# Patient Record
Sex: Male | Born: 1970 | Hispanic: Yes | Marital: Single | Smoking: Never smoker
Health system: Southern US, Community
[De-identification: ages and names within clinical notes are randomized; demographics above are authoritative.]

---

## 2017-12-21 ENCOUNTER — Encounter (HOSPITAL_COMMUNITY): Payer: Self-pay | Admitting: Emergency Medicine

## 2017-12-21 ENCOUNTER — Other Ambulatory Visit: Payer: Self-pay

## 2017-12-21 DIAGNOSIS — R1032 Left lower quadrant pain: Secondary | ICD-10-CM | POA: Insufficient documentation

## 2017-12-21 DIAGNOSIS — M545 Low back pain: Secondary | ICD-10-CM | POA: Insufficient documentation

## 2017-12-21 NOTE — ED Triage Notes (Signed)
Pt presents with LLQ pain that radiated to his lower back and makes it difficult for him to raise his leg. Patient denies urinary or GI symptoms.

## 2017-12-22 ENCOUNTER — Emergency Department (HOSPITAL_COMMUNITY): Payer: Self-pay

## 2017-12-22 ENCOUNTER — Emergency Department (HOSPITAL_COMMUNITY)
Admission: EM | Admit: 2017-12-22 | Discharge: 2017-12-22 | Disposition: A | Discharge status: Home / Self Care | Payer: Self-pay | Attending: Emergency Medicine | Admitting: Emergency Medicine

## 2017-12-22 DIAGNOSIS — M545 Low back pain, unspecified: Secondary | ICD-10-CM

## 2017-12-22 LAB — CBC WITH DIFFERENTIAL/PLATELET
ABS IMMATURE GRANULOCYTES: 0.02 10*3/uL (ref 0.00–0.07)
Basophils Absolute: 0 10*3/uL (ref 0.0–0.1)
Basophils Relative: 0 %
Eosinophils Absolute: 0 10*3/uL (ref 0.0–0.5)
Eosinophils Relative: 0 %
HCT: 45.7 % (ref 39.0–52.0)
Hemoglobin: 15.8 g/dL (ref 13.0–17.0)
Immature Granulocytes: 0 %
Lymphocytes Relative: 13 %
Lymphs Abs: 1.3 10*3/uL (ref 0.7–4.0)
MCH: 30.4 pg (ref 26.0–34.0)
MCHC: 34.6 g/dL (ref 30.0–36.0)
MCV: 87.9 fL (ref 80.0–100.0)
MONO ABS: 0.5 10*3/uL (ref 0.1–1.0)
Monocytes Relative: 5 %
NEUTROS ABS: 7.7 10*3/uL (ref 1.7–7.7)
Neutrophils Relative %: 82 %
Platelets: 281 10*3/uL (ref 150–400)
RBC: 5.2 MIL/uL (ref 4.22–5.81)
RDW: 12.2 % (ref 11.5–15.5)
WBC: 9.6 10*3/uL (ref 4.0–10.5)
nRBC: 0 % (ref 0.0–0.2)

## 2017-12-22 LAB — URINALYSIS, ROUTINE W REFLEX MICROSCOPIC
Bacteria, UA: NONE SEEN
Bilirubin Urine: NEGATIVE
Glucose, UA: NEGATIVE mg/dL
Ketones, ur: 5 mg/dL — AB
Leukocytes, UA: NEGATIVE
Nitrite: NEGATIVE
Protein, ur: NEGATIVE mg/dL
Specific Gravity, Urine: 1.028 (ref 1.005–1.030)
pH: 5 (ref 5.0–8.0)

## 2017-12-22 LAB — COMPREHENSIVE METABOLIC PANEL
ALBUMIN: 5.1 g/dL — AB (ref 3.5–5.0)
ALT: 28 U/L (ref 0–44)
AST: 29 U/L (ref 15–41)
Alkaline Phosphatase: 64 U/L (ref 38–126)
Anion gap: 9 (ref 5–15)
BUN: 18 mg/dL (ref 6–20)
CO2: 25 mmol/L (ref 22–32)
Calcium: 9.5 mg/dL (ref 8.9–10.3)
Chloride: 107 mmol/L (ref 98–111)
Creatinine, Ser: 0.76 mg/dL (ref 0.61–1.24)
GFR calc Af Amer: >60 mL/min (ref >60)
GFR calc non Af Amer: >60 mL/min (ref >60)
Glucose, Bld: 104 mg/dL — ABNORMAL HIGH (ref 70–99)
POTASSIUM: 3.9 mmol/L (ref 3.5–5.1)
SODIUM: 141 mmol/L (ref 135–145)
Total Bilirubin: 1.1 mg/dL (ref 0.3–1.2)
Total Protein: 7.5 g/dL (ref 6.5–8.1)

## 2017-12-22 LAB — LIPASE, BLOOD: LIPASE: 26 U/L (ref 11–51)

## 2017-12-22 MED ORDER — CYCLOBENZAPRINE HCL 10 MG PO TABS
10.0000 mg | ORAL_TABLET | Freq: Once | ORAL | Status: AC
  Administered 2017-12-22: 10 mg via ORAL
  Filled 2017-12-22: qty 1

## 2017-12-22 MED ORDER — CYCLOBENZAPRINE HCL 10 MG PO TABS: 10.0000 mg | ORAL_TABLET | Freq: Three times a day (TID) | ORAL | 0 refills | Status: AC | PRN

## 2017-12-22 MED ORDER — KETOROLAC TROMETHAMINE 30 MG/ML IJ SOLN
30.0000 mg | Freq: Once | INTRAMUSCULAR | Status: AC
  Administered 2017-12-22: 30 mg via INTRAVENOUS
  Filled 2017-12-22: qty 1

## 2017-12-22 MED ORDER — TRAMADOL HCL 50 MG PO TABS: 50.0000 mg | ORAL_TABLET | Freq: Four times a day (QID) | ORAL | 0 refills | Status: AC | PRN

## 2017-12-22 MED ORDER — ONDANSETRON HCL 4 MG/2ML IJ SOLN
4.0000 mg | Freq: Once | INTRAMUSCULAR | Status: AC
  Administered 2017-12-22: 4 mg via INTRAVENOUS
  Filled 2017-12-22: qty 2

## 2017-12-22 MED ORDER — MORPHINE SULFATE (PF) 4 MG/ML IV SOLN
4.0000 mg | Freq: Once | INTRAVENOUS | Status: AC
  Administered 2017-12-22: 4 mg via INTRAVENOUS
  Filled 2017-12-22: qty 1

## 2017-12-22 NOTE — ED Provider Notes (Signed)
[facility]-EMERGENCY DEPT Provider Note   CSN: [id number] Arrival date & time: 12/21/17  2313     History   Chief Complaint Chief Complaint  Patient presents with  . Abdominal Pain    HPI Patrick Fischer is a 47 y.o. male.  The history is provided by the patient.  Abdominal Pain    He complains of pain in the left flank radiating to the left lower abdomen for the last 3 days.  Pain is worse with movement.  He denies nausea or vomiting and denies any urinary urgency or frequency or tenesmus or dysuria.  He denies fever, chills, sweats.  He has not taken anything for pain.  Is unable to put a number on the pain.  He denies any recent trauma or unusual activity.  History reviewed. No pertinent past medical history.  There are no active problems to display for this patient.   History reviewed. No pertinent surgical history.      Home Medications    Prior to Admission medications   Not on File    Family History No family history on file.  Social History Social History   Tobacco Use  . Smoking status: Never Smoker  . Smokeless tobacco: Never Used  Substance Use Topics  . Alcohol use: Yes  . Drug use: Never     Allergies   Patient has no known allergies.   Review of Systems Review of Systems  Gastrointestinal: Positive for abdominal pain.  All other systems reviewed and are negative.    Physical Exam Updated Vital Signs BP 122/82   Pulse 68   Temp 97.6 F (36.4 C) (Oral)   Resp 20   SpO2 100%   Physical Exam Nursing note reviewed. Exam conducted with a chaperone present.    47 year old male, resting comfortably and in no acute distress. Vital signs are normal. Oxygen saturation is 100%, which is normal. Head is normocephalic and atraumatic. PERRLA, EOMI. Oropharynx is clear. Neck is nontender and supple without adenopathy or JVD. Back has mild tenderness in the mid lumbar area.  There is mild left CVA tenderness.   Straight leg raise is positive on the left at 30 degrees.  There is mild to moderate bilateral paralumbar spasm. Lungs are clear without rales, wheezes, or rhonchi. Chest is nontender. Heart has regular rate and rhythm without murmur. Abdomen is soft, flat, with mild left lower quadrant tenderness.  There is no rebound or guarding.  There are no masses or hepatosplenomegaly and peristalsis is hypoactive. Extremities have no cyanosis or edema, full range of motion is present. Skin is warm and dry without rash. Neurologic: Mental status is normal, cranial nerves are intact, there are no motor or sensory deficits.  ED Treatments / Results  Labs (all labs ordered are listed, but only abnormal results are displayed) Labs Reviewed  COMPREHENSIVE METABOLIC PANEL - Abnormal; Notable for the following components:      Result Value   Glucose, Bld 104 (*)    Albumin 5.1 (*)    All other components within normal limits  URINALYSIS, ROUTINE W REFLEX MICROSCOPIC - Abnormal; Notable for the following components:   APPearance HAZY (*)    Hgb urine dipstick SMALL (*)    Ketones, ur 5 (*)    All other components within normal limits  LIPASE, BLOOD  CBC WITH DIFFERENTIAL/PLATELET   Radiology Ct Renal Stone Study  Result Date: 12/22/2017 CLINICAL DATA:  Acute onset of lower back pain and flank pain.  EXAM: CT ABDOMEN AND PELVIS WITHOUT CONTRAST TECHNIQUE: Multidetector CT imaging of the abdomen and pelvis was performed following the standard protocol without IV contrast. COMPARISON:  None. FINDINGS: Lower chest: The visualized lung bases are clear. The visualized portions of the mediastinum are unremarkable. Hepatobiliary: The liver is unremarkable in appearance. The gallbladder is unremarkable in appearance. The common bile duct remains normal in caliber. Pancreas: The pancreas is within normal limits. Spleen: The spleen is unremarkable in appearance. Adrenals/Urinary Tract: The adrenal glands are  unremarkable in appearance. The kidneys are within normal limits. There is no evidence of hydronephrosis. No renal or ureteral stones are identified. No perinephric stranding is seen. Stomach/Bowel: The stomach is unremarkable in appearance. The small bowel is within normal limits. The appendix is normal in caliber, without evidence of appendicitis. The colon is unremarkable in appearance. Vascular/Lymphatic: Mild scattered calcification is seen along the abdominal aorta and its branches. The abdominal aorta is otherwise grossly unremarkable. The inferior vena cava is grossly unremarkable. No retroperitoneal lymphadenopathy is seen. No pelvic sidewall lymphadenopathy is identified. Reproductive: The bladder is decompressed and not well characterized. The prostate remains normal in size. Other: No additional soft tissue abnormalities are seen. Musculoskeletal: No acute osseous abnormalities are identified. The visualized musculature is unremarkable in appearance. IMPRESSION: No acute abnormality seen within the abdomen or pelvis. Aortic Atherosclerosis (ICD10-I70.0). Electronically Signed   By: Roanna RaiderJeffery  Chang M.D.   On: 12/22/2017 00:46    Procedures Procedures   Medications Ordered in ED Medications  cyclobenzaprine (FLEXERIL) tablet 10 mg (has no administration in time range)  morphine 4 MG/ML injection 4 mg (4 mg Intravenous Given 12/22/17 0042)  ketorolac (TORADOL) 30 MG/ML injection 30 mg (30 mg Intravenous Given 12/22/17 0042)  ondansetron (ZOFRAN) injection 4 mg (4 mg Intravenous Given 12/22/17 0042)     Initial Impression / Assessment and Plan / ED Course  I have reviewed the triage vital signs and the nursing notes.  Pertinent labs & imaging results that were available during my care of the patient were reviewed by me and considered in my medical decision making (see chart for details).  Left flank and left lower quadrant pain having characteristics of both musculoskeletal pain and  urolithiasis.  Will check screening labs and sent for renal stone protocol CT scan.  Will be given ketorolac, morphine, ondansetron for pain.  He has no prior [facility] in [facility].  He feels much better after above-noted treatment.  CT shows no evidence of urolithiasis or other significant pathology.  Labs are unremarkable.  Pain appears to be strictly musculoskeletal.  He is discharged with prescriptions for tramadol and cyclobenzaprine, told to use over-the-counter NSAIDs and acetaminophen as needed for less severe pain.  Final Clinical Impressions(s) / ED Diagnoses   Final diagnoses:  Acute left-sided low back pain without sciatica    ED Discharge Orders         Ordered    traMADol (ULTRAM) 50 MG tablet  Every 6 hours PRN     12/22/17 0224    cyclobenzaprine (FLEXERIL) 10 MG tablet  3 times daily PRN     12/22/17 0224           Dione BoozeGlick, Hagan Maltz, MD 12/22/17 [phone]

## 2017-12-22 NOTE — Discharge Instructions (Addendum)
Aplique hielo durante 30 minutos, 3-4 veces al da.  Tome ibuprofeno o naproxeno segn sea necesario para [employer] Technology Officerel dolor. Tome acetaminofeno segn sea necesario para Dietitianaliviar el dolor adicional.

## 2020-01-28 IMAGING — CT CT RENAL STONE PROTOCOL
2 of 4 series · 16 of 46 positions shown, 18 images · non-contrast
Comparison: None.

CLINICAL DATA: Acute onset of lower back pain and flank pain.

EXAM:
CT ABDOMEN AND PELVIS WITHOUT CONTRAST
TECHNIQUE: Multidetector CT imaging of the abdomen and pelvis was performed
following the standard protocol without IV contrast.

[Series 2: axial st · axial · 0.81mm/px · z∈[-430,-50]mm · 13 of 88 slices shown, 15 images]
[im 6/88  soft-tissue]
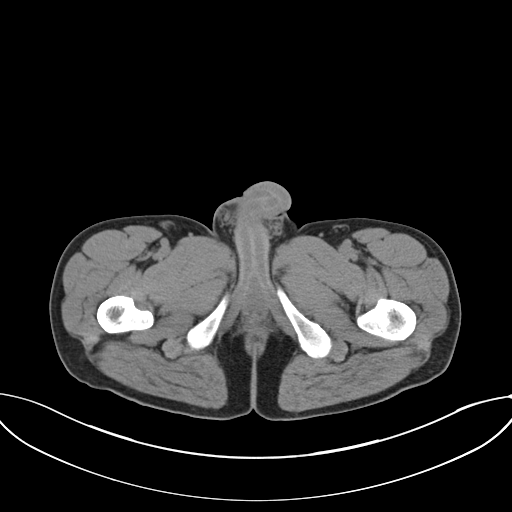
[im 6/88  bone]
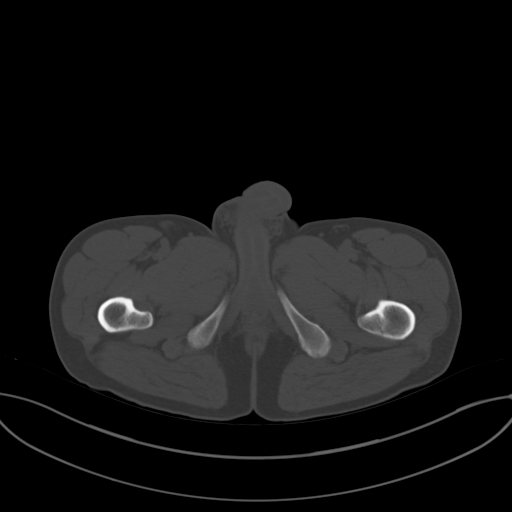
[im 11/88  soft-tissue]
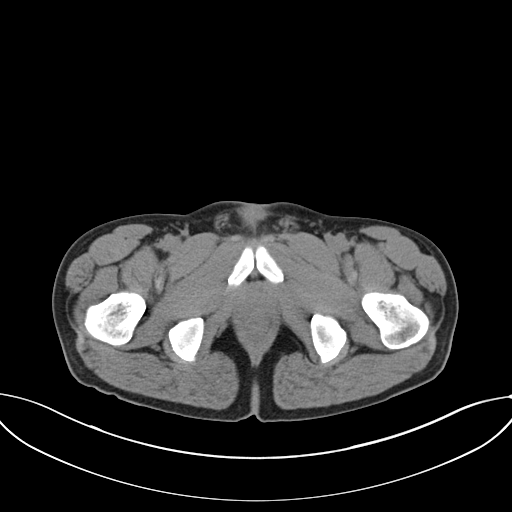
[im 21/88  soft-tissue]
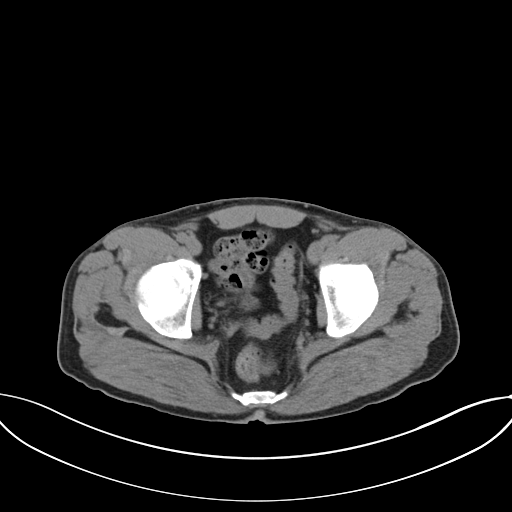
[im 26/88  soft-tissue]
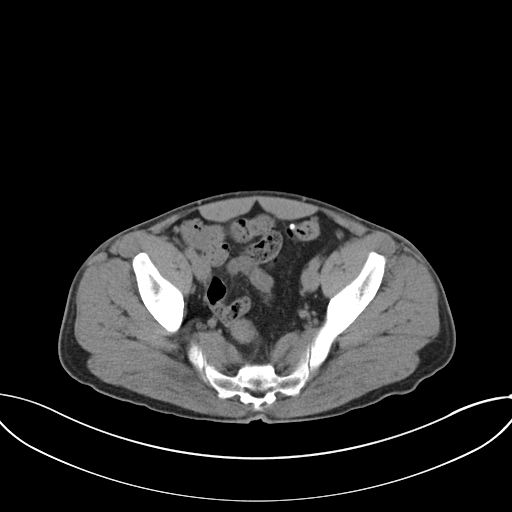
[im 31/88  soft-tissue]
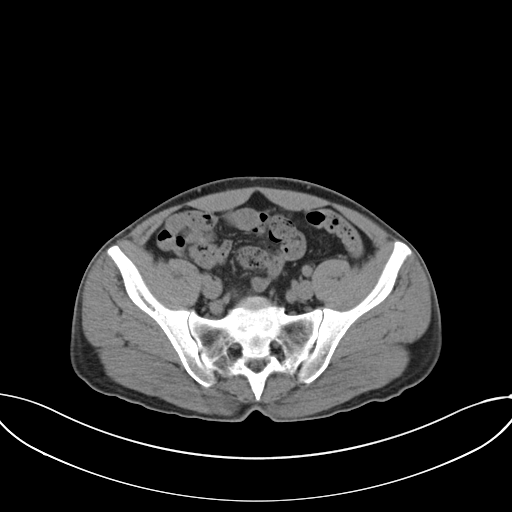
[im 36/88  soft-tissue]
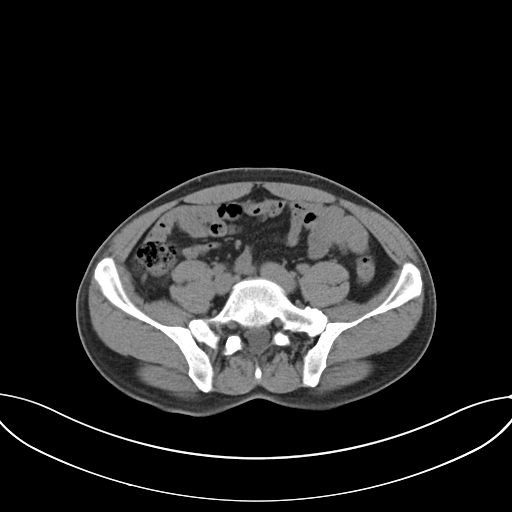
[im 47/88  soft-tissue]
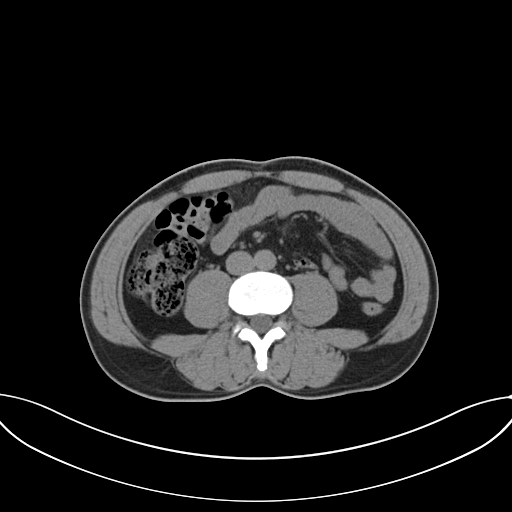
[im 52/88  soft-tissue]
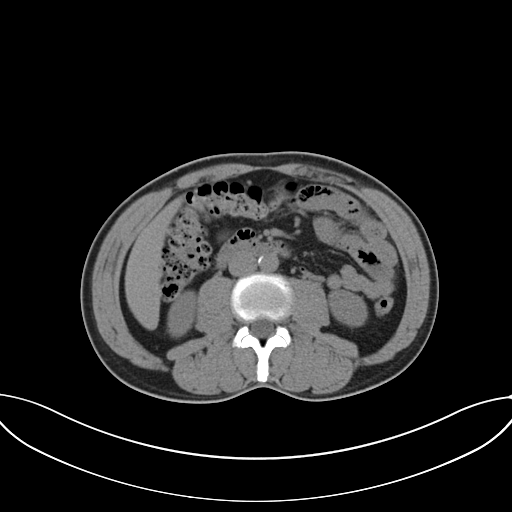
[im 57/88  soft-tissue]
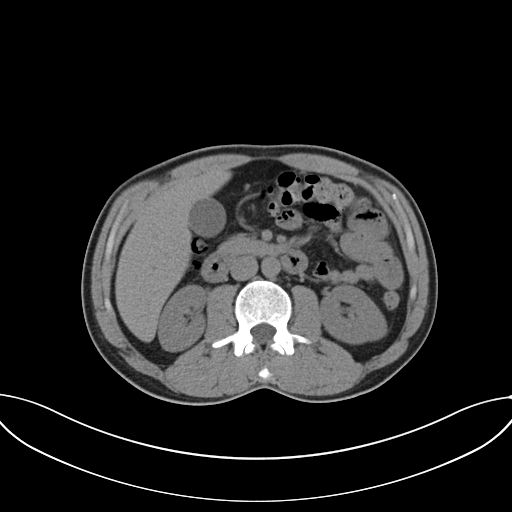
[im 57/88  bone]
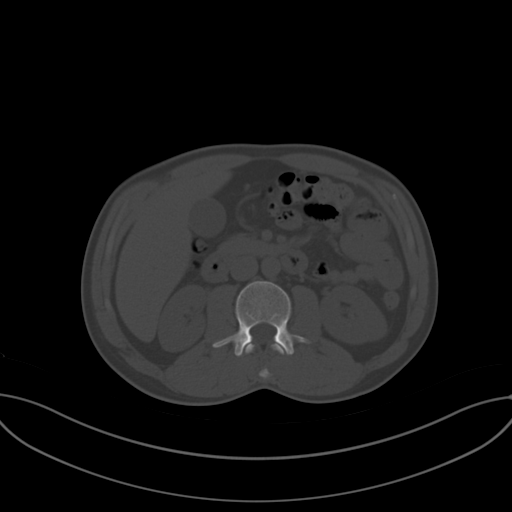
[im 62/88  soft-tissue]
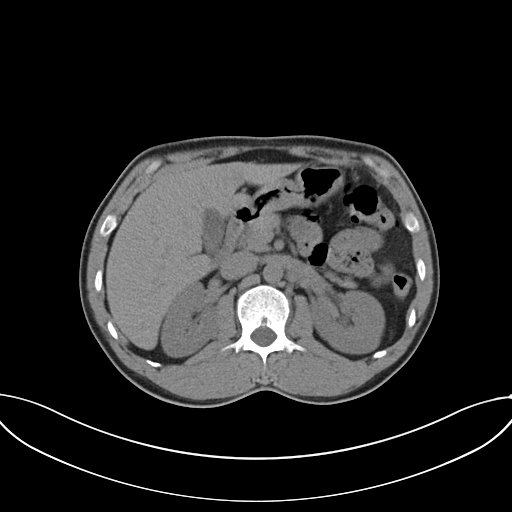
[im 67/88  soft-tissue]
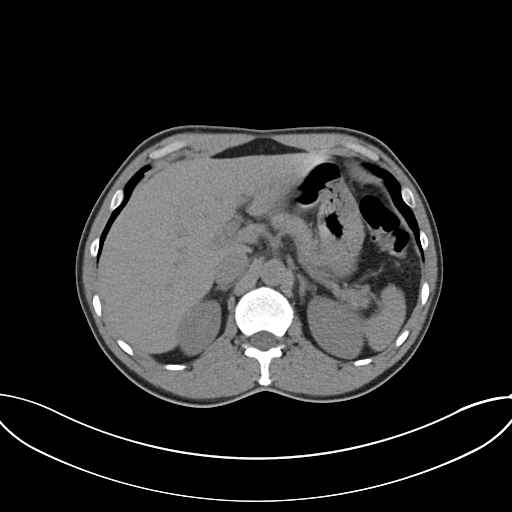
[im 77/88  soft-tissue]
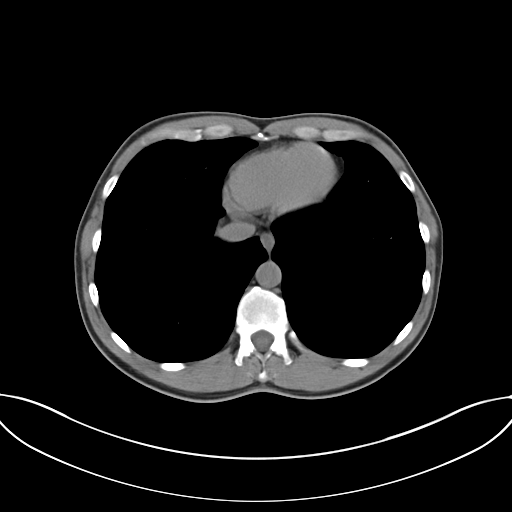
[im 82/88  soft-tissue]
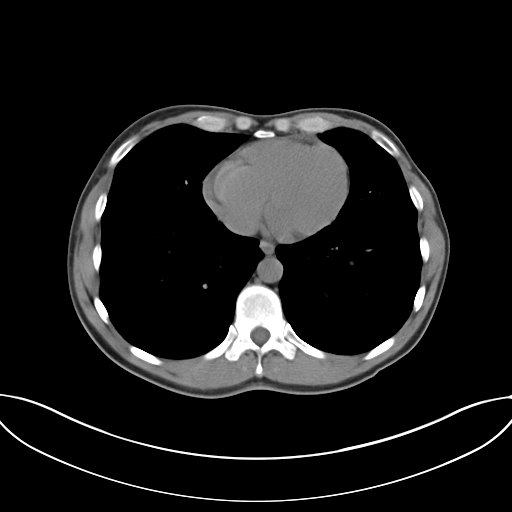

[Series 4: coronal · coronal · 0.73mm/px · 3 of 149 slices shown]
[im 50/149  soft-tissue]
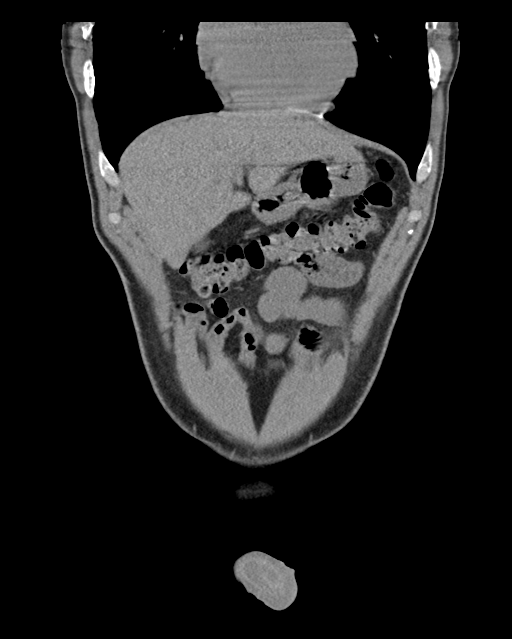
[im 66/149  soft-tissue]
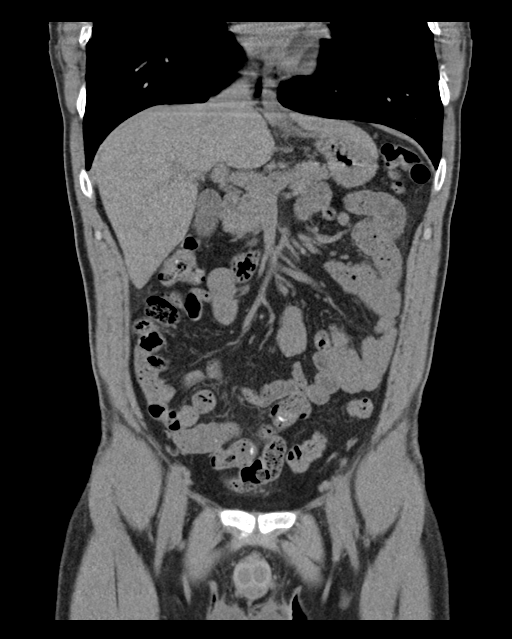
[im 83/149  soft-tissue]
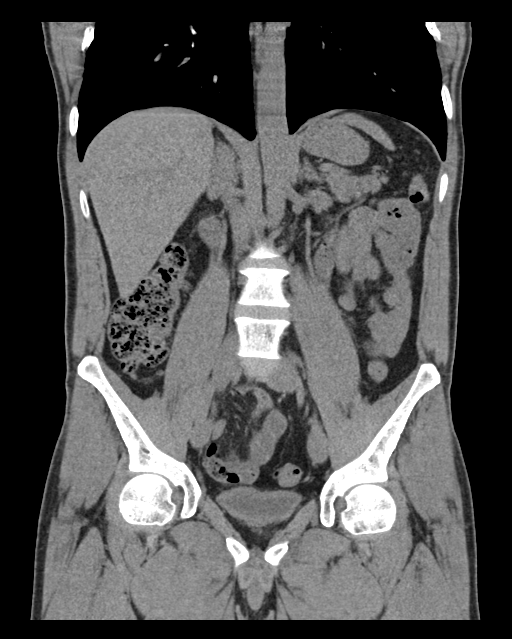

[16 of 46 positions shown; findings below may reference images not displayed]

FINDINGS: Lower chest: The visualized lung bases are clear. The visualized
portions of the mediastinum are unremarkable.

Hepatobiliary: The liver is unremarkable in appearance. The
gallbladder is unremarkable in appearance. The common bile duct
remains normal in caliber.

Pancreas: The pancreas is within normal limits.

Spleen: The spleen is unremarkable in appearance.

Adrenals/Urinary Tract: The adrenal glands are unremarkable in
appearance. The kidneys are within normal limits. There is no
evidence of hydronephrosis. No renal or ureteral stones are
identified. No perinephric stranding is seen.

Stomach/Bowel: The stomach is unremarkable in appearance. The small
bowel is within normal limits. The appendix is normal in caliber,
without evidence of appendicitis. The colon is unremarkable in
appearance.

Vascular/Lymphatic: Mild scattered calcification is seen along the
abdominal aorta and its branches. The abdominal aorta is otherwise
grossly unremarkable. The inferior vena cava is grossly
unremarkable. No retroperitoneal lymphadenopathy is seen. No pelvic
sidewall lymphadenopathy is identified.

Reproductive: The bladder is decompressed and not well
characterized. The prostate remains normal in size.

Other: No additional soft tissue abnormalities are seen.

Musculoskeletal: No acute osseous abnormalities are identified. The
visualized musculature is unremarkable in appearance.
IMPRESSION: No acute abnormality seen within the abdomen or pelvis.

Aortic Atherosclerosis ([id number]).
